# Patient Record
Sex: Male | Born: 1955
Health system: Southern US, Community
[De-identification: ages and names within clinical notes are randomized; demographics above are authoritative.]

## PROBLEM LIST (undated history)

## (undated) DIAGNOSIS — R7303 Prediabetes: Secondary | ICD-10-CM

## (undated) HISTORY — PX: COLONOSCOPY: SHX174

---

## 2017-09-02 ENCOUNTER — Other Ambulatory Visit (HOSPITAL_COMMUNITY): Payer: Self-pay | Admitting: Physician Assistant

## 2017-09-02 ENCOUNTER — Ambulatory Visit (HOSPITAL_COMMUNITY)
Admission: RE | Admit: 2017-09-02 | Discharge: 2017-09-02 | Disposition: A | Discharge status: Home / Self Care | Payer: BLUE CROSS/BLUE SHIELD | Source: Ambulatory Visit | Attending: Physician Assistant | Admitting: Physician Assistant

## 2017-09-02 DIAGNOSIS — M25562 Pain in left knee: Secondary | ICD-10-CM

## 2019-09-23 IMAGING — DX DG KNEE COMPLETE 4+V*L*
4 series · 4 of 4 positions shown · non-contrast
Comparison: None.

CLINICAL DATA: Pain following fall several weeks prior

EXAM:
LEFT KNEE - COMPLETE 4+ VIEW

[knee ap (1 of 3)]
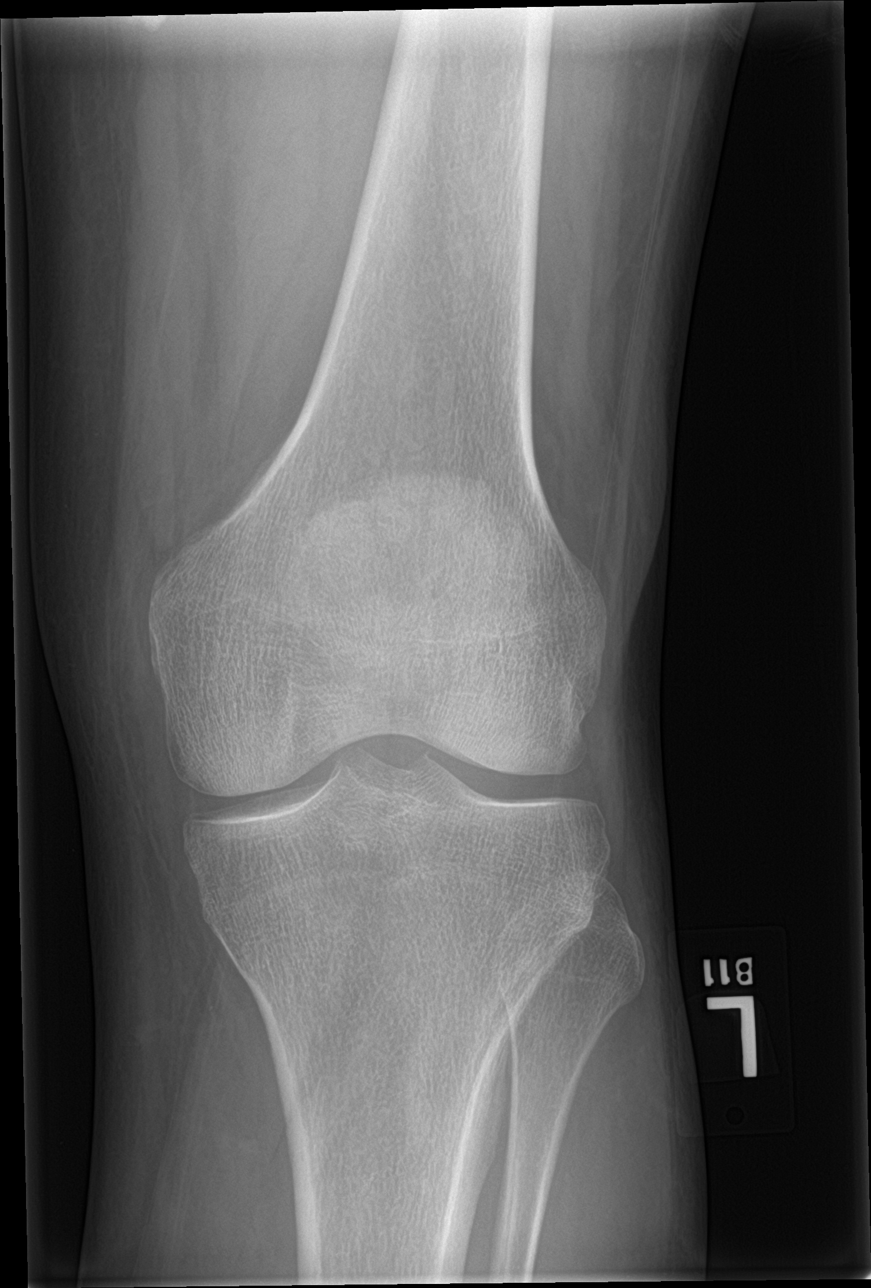

[knee ap (2 of 3)]
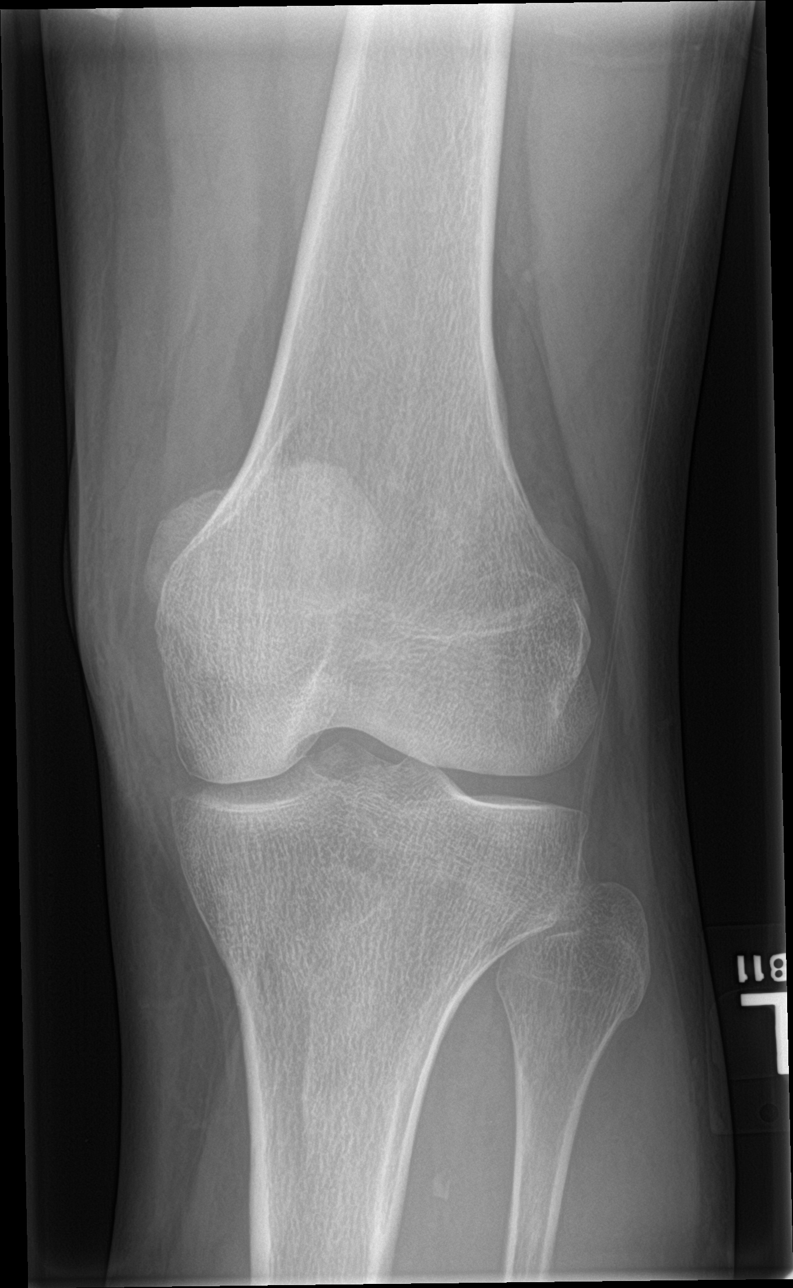

[knee ap (3 of 3)]
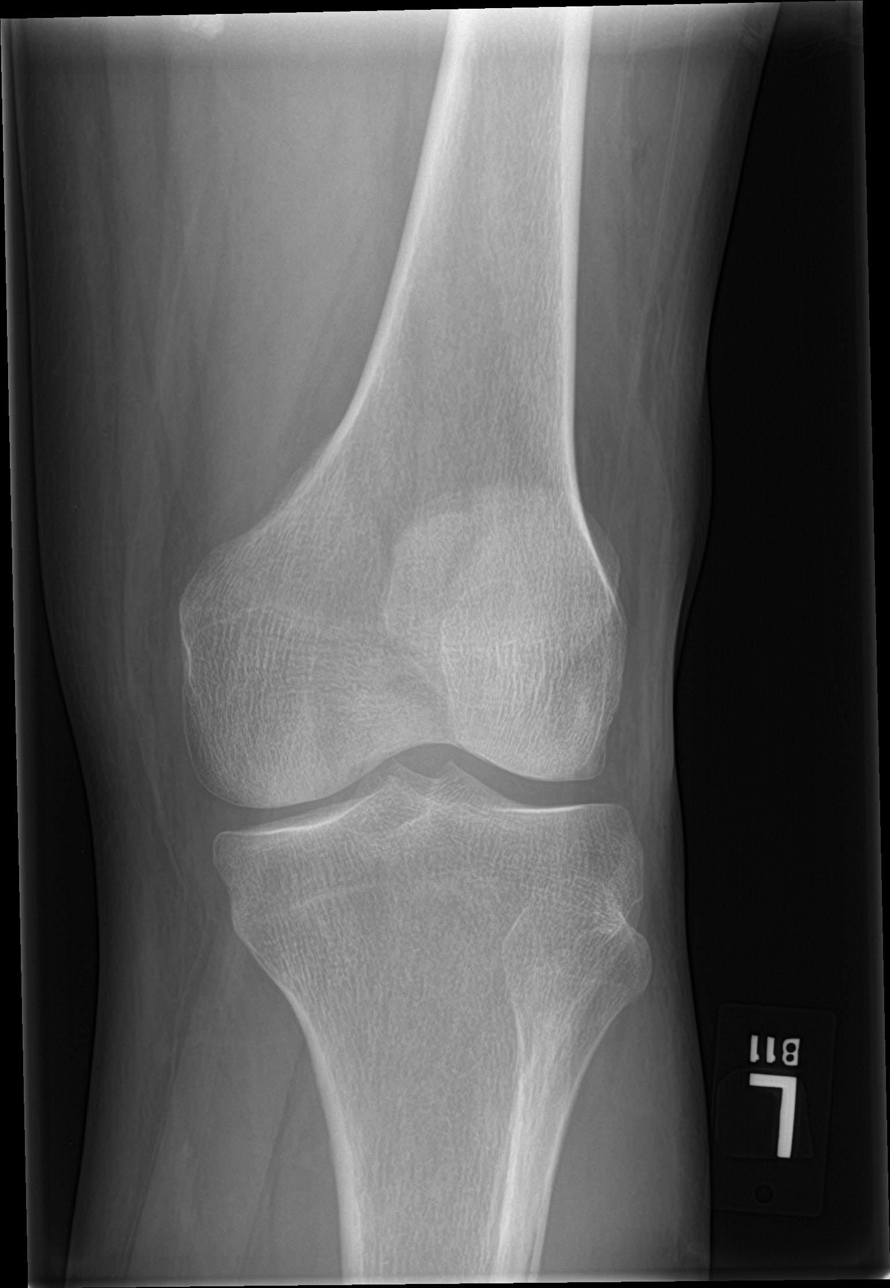

[knee lat]
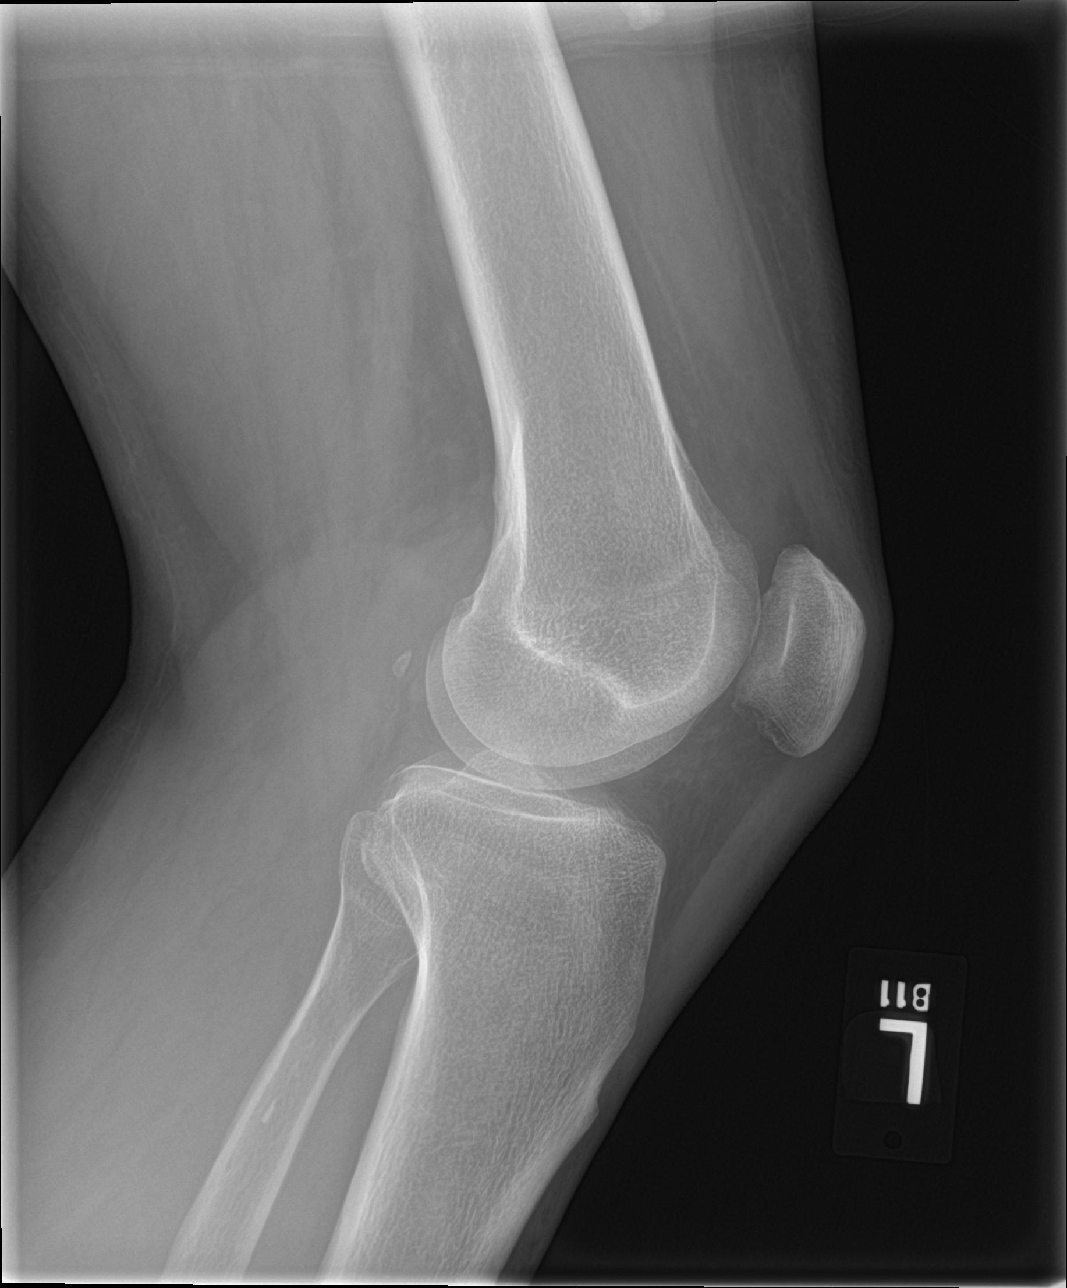

[4 of 4 positions shown; findings below may reference images not displayed]

FINDINGS: Frontal, lateral, and bilateral oblique views were obtained. No
fracture or dislocation. No joint effusion. Joint spaces appear
normal. No erosive change.
IMPRESSION: No fracture or dislocation. No joint effusion. No evident
adenopathy.

## 2020-06-21 ENCOUNTER — Emergency Department (HOSPITAL_COMMUNITY)
Admission: EM | Admit: 2020-06-21 | Discharge: 2020-06-21 | Disposition: A | Discharge status: Home / Self Care | Payer: 59 | Attending: Emergency Medicine | Admitting: Emergency Medicine

## 2020-06-21 ENCOUNTER — Emergency Department (HOSPITAL_COMMUNITY): Payer: 59

## 2020-06-21 ENCOUNTER — Encounter (HOSPITAL_COMMUNITY): Payer: Self-pay | Admitting: *Deleted

## 2020-06-21 ENCOUNTER — Other Ambulatory Visit: Payer: Self-pay

## 2020-06-21 DIAGNOSIS — S63282A Dislocation of proximal interphalangeal joint of right middle finger, initial encounter: Secondary | ICD-10-CM | POA: Diagnosis not present

## 2020-06-21 DIAGNOSIS — W01198A Fall on same level from slipping, tripping and stumbling with subsequent striking against other object, initial encounter: Secondary | ICD-10-CM | POA: Diagnosis not present

## 2020-06-21 DIAGNOSIS — Z87891 Personal history of nicotine dependence: Secondary | ICD-10-CM | POA: Insufficient documentation

## 2020-06-21 DIAGNOSIS — S6992XA Unspecified injury of left wrist, hand and finger(s), initial encounter: Secondary | ICD-10-CM | POA: Diagnosis present

## 2020-06-21 DIAGNOSIS — S63253A Unspecified dislocation of left middle finger, initial encounter: Secondary | ICD-10-CM

## 2020-06-21 MED ORDER — LIDOCAINE HCL (PF) 1 % IJ SOLN: 10.0000 mL | Freq: Once | INTRAMUSCULAR | Status: DC

## 2020-06-21 MED ORDER — LIDOCAINE HCL (PF) 1 % IJ SOLN
INTRAMUSCULAR | Status: AC
  Filled 2020-06-21: qty 10

## 2020-06-21 NOTE — ED Triage Notes (Signed)
Pt slipped on ice this morning landing on left side, denies hitting his head.  Deformity noted to left middle finger since fall.  C/o left side aching but no other symptoms.

## 2020-06-21 NOTE — Discharge Instructions (Signed)
Tylenol, Motrin, ice for pain.  Keep the finger splint on over the next week.  May return any worsening symptoms.

## 2020-06-21 NOTE — ED Provider Notes (Signed)
[facility] Provider Note   CSN: [id number] Arrival date & time: 06/21/20  1056     History Chief Complaint  Patient presents with  . Fall    Anthony Perez is a 65 y.o. male with no significant past medical history who presents for evaluation of finger pain.  Had a mechanical fall when he slipped on ice this morning.  Noted deformity to his left middle finger.  He has pain which he rates a 5/10.  Described as aching.  He denies hitting his head, LOC or anticoagulation.  He denies any other pain.  Tetanus up-to-date.  Does have abrasion to dorsal aspect right hand, midshaft forth metacarpal.  No pain to wrist, proximal hand.  Denies fever, chills, nausea, vomiting, headache, lightness, dizziness, chest pain, shortness of breath, paresthesias, weakness.  No preceding symptoms prior to fall.  He has been ambulatory since the incident.  Has not taken anything for pain.  History obtained from patient and past medical records.  No interpreter used  HPI     History reviewed. No pertinent past medical history.  There are no problems to display for this patient.   History reviewed. No pertinent surgical history.     History reviewed. No pertinent family history.  Social History   Tobacco Use  . Smoking status: Former Research scientist (life sciences)  . Smokeless tobacco: Never Used  Substance Use Topics  . Drug use: Never    Home Medications Prior to Admission medications   Not on File    Allergies    Patient has no known allergies.  Review of Systems   Review of Systems  Constitutional: Negative.   HENT: Negative.   Respiratory: Negative.   Cardiovascular: Negative.   Gastrointestinal: Negative.   Genitourinary: Negative.   Musculoskeletal:       Deformity to left middle finger at PIP  Skin: Positive for wound.  Neurological: Negative.   All other systems reviewed and are negative.   Physical Exam Updated Vital Signs BP (!) 163/88 (BP Location: Right Arm)    Pulse (!) 59   Temp 97.7 F (36.5 C) (Oral)   Resp 20   Ht 6\' 2"  (1.88 m)   Wt 85.7 kg   SpO2 97%   BMI 24.27 kg/m   Physical Exam Vitals and nursing note reviewed.  Constitutional:      General: He is not in acute distress.    Appearance: He is well-developed and well-nourished. He is not ill-appearing, toxic-appearing or diaphoretic.  HENT:     Head: Normocephalic and atraumatic.     Nose: Nose normal.     Mouth/Throat:     Mouth: Mucous membranes are moist.  Eyes:     Pupils: Pupils are equal, round, and reactive to light.  Cardiovascular:     Rate and Rhythm: Normal rate and regular rhythm.     Pulses: Normal pulses.     Heart sounds: Normal heart sounds.  Pulmonary:     Effort: Pulmonary effort is normal. No respiratory distress.     Breath sounds: Normal breath sounds.  Abdominal:     General: Bowel sounds are normal. There is no distension.     Palpations: Abdomen is soft. There is no mass.     Tenderness: There is no abdominal tenderness. There is no right CVA tenderness, left CVA tenderness, guarding or rebound.     Hernia: No hernia is present.  Musculoskeletal:        General: Swelling, tenderness, deformity and signs of  injury present. Normal range of motion.       Hands:     Cervical back: Normal range of motion and neck supple.     Comments: Obvious deformity to third digit, left upper extremity at PIP.  Appears dislocated.  There is some soft tissue swelling.  No bony tenderness to rest, proximal arm.  Moves all 4 extremities at difficulty.  Stable, nontender palpation.  No midline spinal tenderness.  Skin:    General: Skin is warm and dry.     Capillary Refill: Capillary refill takes less than 2 seconds.     Comments: Skin tear to dorsum of left hand.  No active bleeding or drainage.  Neurological:     Mental Status: He is alert.     Cranial Nerves: Cranial nerves are intact.     Sensory: Sensation is intact.     Motor: Motor function is intact.      Coordination: Coordination is intact.     Gait: Gait is intact.  Psychiatric:        Mood and Affect: Mood and affect normal.    ED Results / Procedures / Treatments   Labs (all labs ordered are listed, but only abnormal results are displayed) Labs Reviewed - No data to display  EKG None  Radiology DG Finger Middle Left  Result Date: 06/21/2020 CLINICAL DATA:  Slipped on ice this morning landing on LEFT side, deformity and aching of RIGHT LEFT middle finger, post reduction EXAM: LEFT MIDDLE FINGER 2+V COMPARISON:  None FINDINGS: Osseous mineralization normal. Soft tissue swelling at PIP joint LEFT middle finger extending into proximal phalanx. Reduction of previously identified PIP dislocation LEFT little finger. No definite fracture or bone destruction seen. Jewelry artifact at ring finger. IMPRESSION: Interval reduction of previously identified PIP joint dislocation LEFT middle finger. Electronically Signed   By: Lavonia Dana M.D.   On: 06/21/2020 14:45   DG Finger Middle Left  Result Date: 06/21/2020 CLINICAL DATA:  Post fall earlier today now with middle finger dislocation EXAM: LEFT MIDDLE FINGER 2+V COMPARISON:  None. FINDINGS: Dislocation of the PIP joint of the middle digit with the base of the middle phalanx perched upon the distal dorsal aspect of the proximal phalanx with slight angulation, apex lateral. No definitive associated fracture. Expected adjacent soft tissue swelling. No radiopaque foreign body. Joint spaces are otherwise well preserved. No definite erosions. No additional fractures identified. IMPRESSION: Dislocation of the PIP joint of the middle digit without associated fracture. Electronically Signed   By: Sandi Mariscal M.D.   On: 06/21/2020 12:11    Procedures Reduction of dislocation  Date/Time: 06/21/2020 3:05 PM Performed by: Nettie Elm, PA-C Authorized by: Nettie Elm, PA-C  Consent: Verbal consent obtained. Written consent not obtained. Risks  and benefits: risks, benefits and alternatives were discussed Consent given by: patient Patient understanding: patient states understanding of the procedure being performed Patient consent: the patient's understanding of the procedure matches consent given Procedure consent: procedure consent matches procedure scheduled Relevant documents: relevant documents present and verified Test results: test results available and properly labeled Site marked: the operative site was marked Imaging studies: imaging studies available Patient identity confirmed: verbally with patient and arm band Time out: Immediately prior to procedure a "time out" was called to verify the correct patient, procedure, equipment, support staff and site/side marked as required. Preparation: Patient was prepped and draped in the usual sterile fashion. Local anesthesia used: yes Anesthesia: digital block  Anesthesia: Local anesthesia used: yes  Local Anesthetic: lidocaine 1% without epinephrine Anesthetic total: 4 mL  Sedation: Patient sedated: no  Patient tolerance: patient tolerated the procedure well with no immediate complications  .Nerve Block  Date/Time: 06/21/2020 3:05 PM Performed by: Nettie Elm, PA-C Authorized by: Nettie Elm, PA-C   Consent:    Consent obtained:  Verbal   Consent given by:  Patient   Risks, benefits, and alternatives were discussed: yes     Risks discussed:  Allergic reaction, infection, nerve damage, swelling, unsuccessful block, pain, intravenous injection and bleeding   Alternatives discussed:  No treatment, delayed treatment, alternative treatment and referral Universal protocol:    Procedure explained and questions answered to patient or proxy's satisfaction: yes     Relevant documents present and verified: yes     Test results available: yes     Imaging studies available: yes     Required blood products, implants, devices, and special equipment available: yes      Site/side marked: yes     Immediately prior to procedure, a time out was called: yes     Patient identity confirmed:  Verbally with patient and arm band Indications:    Indications:  Pain relief and procedural anesthesia Location:    Body area:  Upper extremity   Upper extremity nerve blocked: Digit.   Laterality:  Left Pre-procedure details:    Skin preparation:  Chlorhexidine   Preparation: Patient was prepped and draped in usual sterile fashion   Skin anesthesia:    Skin anesthesia method:  None Procedure details:    Block needle gauge:  16 G   Anesthetic injected:  Lidocaine 1% w/o epi   Steroid injected:  None   Additive injected:  None   Injection procedure:  Anatomic landmarks identified, anatomic landmarks palpated, incremental injection, introduced needle and negative aspiration for blood   Paresthesia:  Immediately resolved Post-procedure details:    Dressing:  Sterile dressing   Outcome:  Anesthesia achieved   Procedure completion:  Tolerated well, no immediate complications .Ortho Injury Treatment  Date/Time: 06/21/2020 3:06 PM Performed by: Shelby Dubin A, PA-C Authorized by: Nettie Elm, PA-C   Consent:    Consent obtained:  Verbal   Consent given by:  Patient   Risks discussed:  Fracture, nerve damage, restricted joint movement, vascular damage, stiffness, recurrent dislocation and irreducible dislocation   Alternatives discussed:  Alternative treatment, no treatment, immobilization, referral and delayed treatmentInjury location: finger Location details: left long finger Injury type: dislocation Dislocation type: PIP Pre-procedure neurovascular assessment: neurovascularly intact Pre-procedure distal perfusion: normal Pre-procedure neurological function: normal Pre-procedure range of motion: normal Anesthesia: digital block  Anesthesia: Local anesthesia used: yes Anesthetic total: 4 mL  Patient sedated: NoImmobilization: splint Splint type:  static finger Post-procedure neurovascular assessment: post-procedure neurovascularly intact Post-procedure distal perfusion: normal Post-procedure neurological function: normal Post-procedure range of motion: normal Patient tolerance: patient tolerated the procedure well with no immediate complications    (including critical care time)  Medications Ordered in ED Medications  lidocaine (PF) (XYLOCAINE) 1 % injection 10 mL (has no administration in time range)  lidocaine (PF) (XYLOCAINE) 1 % injection (has no administration in time range)   ED Course  I have reviewed the triage vital signs and the nursing notes.  Pertinent labs & imaging results that were available during my care of the patient were reviewed by me and considered in my medical decision making (see chart for details).  65 year old with dislocation to PIP to middle finger on left upper extremity  which occurred after mechanical fall earlier today.  Has been ambulatory since the incident.  No paresthesias, weakness.  No preceding symptoms such as headache, lightness, dizziness, chest pain, shortness of breath.  He denies hitting his head, LOC or coagulation.  Tolerating p.o. intake.  X-ray obtained from triage shows dislocation to third digit on left upper extremity at PIP.  Digital block performed.  Reduction to dislocation.  See post reduction films.  Successful.  Placed in static finger splint.  Discussed RICE and management.  Will return for new worsening symptoms. Refused to remove ring however not on dislocated finger.  The patient has been appropriately medically screened and/or stabilized in the ED. I have low suspicion for any other emergent medical condition which would require further screening, evaluation or treatment in the ED or require inpatient management.  Patient is hemodynamically stable and in no acute distress.  Patient able to ambulate in department prior to ED.  Evaluation does not show acute pathology that  would require ongoing or additional emergent interventions while in the emergency department or further inpatient treatment.  I have discussed the diagnosis with the patient and answered all questions.  Pain is been managed while in the emergency department and patient has no further complaints prior to discharge.  Patient is comfortable with plan discussed in room and is stable for discharge at this time.  I have discussed strict return precautions for returning to the emergency department.  Patient was encouraged to follow-up with PCP/specialist refer to at discharge.    MDM Rules/Calculators/A&P                           Final Clinical Impression(s) / ED Diagnoses Final diagnoses:  Dislocation of left middle finger, initial encounter    Rx / DC Orders ED Discharge Orders    None       Rigdon Macomber A, PA-C 06/21/20 1509    Davonna Belling, MD 06/21/20 1525

## 2020-12-29 ENCOUNTER — Encounter (INDEPENDENT_AMBULATORY_CARE_PROVIDER_SITE_OTHER): Payer: Self-pay | Admitting: *Deleted

## 2021-03-20 ENCOUNTER — Telehealth (INDEPENDENT_AMBULATORY_CARE_PROVIDER_SITE_OTHER): Payer: Self-pay

## 2021-03-20 ENCOUNTER — Encounter (INDEPENDENT_AMBULATORY_CARE_PROVIDER_SITE_OTHER): Payer: Self-pay | Admitting: *Deleted

## 2021-03-20 ENCOUNTER — Other Ambulatory Visit (INDEPENDENT_AMBULATORY_CARE_PROVIDER_SITE_OTHER): Payer: Self-pay

## 2021-03-20 ENCOUNTER — Encounter (INDEPENDENT_AMBULATORY_CARE_PROVIDER_SITE_OTHER): Payer: Self-pay

## 2021-03-20 DIAGNOSIS — Z1211 Encounter for screening for malignant neoplasm of colon: Secondary | ICD-10-CM

## 2021-03-20 NOTE — Telephone Encounter (Signed)
Referring MD/PCP: Collene Mares   Procedure: Tcs  Reason/Indication:  Screening   Has patient had this procedure before?  yes  If so, when, by whom and where?  2009  Is there a family history of colon cancer?  no  Who?  What age when diagnosed?    Is patient diabetic? If yes, Type 1 or Type 2   no      Does patient have prosthetic heart valve or mechanical valve?  no  Do you have a pacemaker/defibrillator?  no  Has patient ever had endocarditis/atrial fibrillation? no  Does patient use oxygen? no  Has patient had joint replacement within last 12 months?  no  Is patient constipated or do they take laxatives? no  Does patient have a history of alcohol/drug use?  no  Have you had a stroke/heart attack last 6 mths? no  Do you take medicine for weight loss?  no  For male patients,: do you still have your menstrual cycle? N/A  Is patient on blood thinner such as Coumadin, Plavix and/or Aspirin? no  Medications: nond  Allergies: nkda  Medication Adjustment per Dr Jenetta Downer none  Procedure date & time: Wednesday 04/19/21 at 9:00

## 2021-03-20 NOTE — Telephone Encounter (Signed)
Ok to schedule.  Thanks,  Keila Turan Castaneda Mayorga, MD Gastroenterology and Hepatology [facility]  

## 2021-04-17 ENCOUNTER — Telehealth (INDEPENDENT_AMBULATORY_CARE_PROVIDER_SITE_OTHER): Payer: Self-pay

## 2021-04-17 MED ORDER — PEG 3350-KCL-NA BICARB-NACL 420 G PO SOLR: 4000.0000 mL | ORAL | 0 refills | Status: DC

## 2021-04-17 NOTE — Telephone Encounter (Signed)
LeighAnn Kunta Hilleary, CMA  

## 2021-04-19 ENCOUNTER — Ambulatory Visit (HOSPITAL_COMMUNITY): Payer: 59 | Admitting: Anesthesiology

## 2021-04-19 ENCOUNTER — Encounter (HOSPITAL_COMMUNITY): Payer: Self-pay | Admitting: Gastroenterology

## 2021-04-19 ENCOUNTER — Ambulatory Visit (HOSPITAL_COMMUNITY)
Admission: RE | Admit: 2021-04-19 | Discharge: 2021-04-19 | Disposition: A | Discharge status: Home / Self Care | Payer: 59 | Attending: Gastroenterology | Admitting: Gastroenterology

## 2021-04-19 ENCOUNTER — Other Ambulatory Visit: Payer: Self-pay

## 2021-04-19 ENCOUNTER — Encounter (HOSPITAL_COMMUNITY)
Admission: RE | Disposition: A | Discharge status: Home / Self Care | Payer: Self-pay | Source: Home / Self Care | Attending: Gastroenterology

## 2021-04-19 DIAGNOSIS — Z1211 Encounter for screening for malignant neoplasm of colon: Secondary | ICD-10-CM | POA: Diagnosis not present

## 2021-04-19 DIAGNOSIS — Q438 Other specified congenital malformations of intestine: Secondary | ICD-10-CM

## 2021-04-19 DIAGNOSIS — Z87891 Personal history of nicotine dependence: Secondary | ICD-10-CM | POA: Insufficient documentation

## 2021-04-19 DIAGNOSIS — K644 Residual hemorrhoidal skin tags: Secondary | ICD-10-CM

## 2021-04-19 DIAGNOSIS — D124 Benign neoplasm of descending colon: Secondary | ICD-10-CM | POA: Diagnosis not present

## 2021-04-19 DIAGNOSIS — K4090 Unilateral inguinal hernia, without obstruction or gangrene, not specified as recurrent: Secondary | ICD-10-CM | POA: Diagnosis not present

## 2021-04-19 DIAGNOSIS — K649 Unspecified hemorrhoids: Secondary | ICD-10-CM | POA: Diagnosis not present

## 2021-04-19 HISTORY — PX: POLYPECTOMY: SHX5525

## 2021-04-19 HISTORY — PX: COLONOSCOPY WITH PROPOFOL: SHX5780

## 2021-04-19 SURGERY — COLONOSCOPY WITH PROPOFOL
Anesthesia: General

## 2021-04-19 MED ORDER — PROPOFOL 1000 MG/100ML IV EMUL
INTRAVENOUS | Status: AC
  Filled 2021-04-19: qty 100

## 2021-04-19 MED ORDER — PROPOFOL 500 MG/50ML IV EMUL
INTRAVENOUS | Status: DC | PRN
  Administered 2021-04-19: 150 ug/kg/min via INTRAVENOUS

## 2021-04-19 MED ORDER — PROPOFOL 10 MG/ML IV BOLUS
INTRAVENOUS | Status: DC | PRN
  Administered 2021-04-19: 100 mg via INTRAVENOUS

## 2021-04-19 MED ORDER — LACTATED RINGERS IV SOLN
INTRAVENOUS | Status: DC
  Administered 2021-04-19: 1000 mL via INTRAVENOUS

## 2021-04-19 NOTE — Discharge Instructions (Addendum)
You are being discharged to home.  Resume your previous diet.  We are waiting for your pathology results.  Consider virtual colonoscopy. Surgery evaluation for inguinal hernia repair. Repeat colonoscopy can be performed in the future if inguinal hernia is repaired.     Leann notified at the office for referral to Surgeon for Hernia Repair.   Office to call. Per Dr. Jenetta Downer call office if difficulty passing bowel movement or passing urine, come to ER if Emergency

## 2021-04-19 NOTE — Op Note (Signed)
[facility] Patient Name: Anthony Perez Procedure Date: 04/19/2021 8:08 AM MRN: [id number] Date of Birth: [birth year] Attending MD: Maylon Peppers ,  CSN: [id number] Age: 65 Admit Type: Outpatient Procedure:                Colonoscopy Indications:              Screening for colorectal malignant neoplasm Providers:                Maylon Peppers, Rosina Lowenstein, RN, Casimer Bilis, Technician Referring MD:              Medicines:                Monitored Anesthesia Care Complications:            No immediate complications. Estimated Blood Loss:     Estimated blood loss: none. Procedure:                Pre-Anesthesia Assessment:                           - Prior to the procedure, a History and Physical                            was performed, and patient medications, allergies                            and sensitivities were reviewed. The patient's                            tolerance of previous anesthesia was reviewed.                           - The risks and benefits of the procedure and the                            sedation options and risks were discussed with the                            patient. All questions were answered and informed                            consent was obtained.                           - ASA Grade Assessment: I - A normal, healthy                            patient.                           After obtaining informed consent, the colonoscope                            was passed under direct vision. Throughout the  procedure, the patient's blood pressure, pulse, and                            oxygen saturations were monitored continuously. The                            [id number]-[id number] ([id number]) was introduced through the                            anus with the intention of advancing to the splenic                            flexure. The scope was advanced to the descending                             colon before the procedure was aborted. Medications                            were not given. The colonoscopy was extremely                            difficult due to significant looping in the sigmoid                            colon - there was presence of a inguinoscotral                            hernia retaining the scope. The patient tolerated                            the procedure well. The quality of the bowel                            preparation was excellent. Scope In: 8:23:30 AM Scope Out: 9:03:01 AM Total Procedure Duration: 0 hours 39 minutes 31 seconds  Findings:      Skin tags were found on perianal exam.      Hemorrhoids were found on perianal exam.      A 4 mm polyp was found in the distal descending colon. The polyp was       sessile. The polyp was removed with a cold snare. Resection and       retrieval were complete.      The sigmoid colon revealed significantly excessive looping. There was       evidence of significant looping in the left inguinoscrotal area due to       presence of hernia. Patient was put on his back and pressure was applied       but significant looping was still present, which onyl allowed passage of       the scope to the med descending colon. Impression:               - Perianal skin tags found on perianal exam.                           - Hemorrhoids  found on perianal exam.                           - One 4 mm polyp in the distal descending colon,                            removed with a cold snare. Resected and retrieved.                           - There was significant looping of the colon. Moderate Sedation:      Per Anesthesia Care Recommendation:           - Discharge patient to home (ambulatory).                           - Resume previous diet.                           - Await pathology results.                           - Will discuss virtual colonoscopy.                           - Surgery evaluation for  inguinal hernia repair.                           - Repeat colonoscopy can be performed in the future                            if inguinal hernia is repaired. Procedure Code(s):        --- Professional ---                           [phone], 52, Colonoscopy, flexible; with removal of                            tumor(s), polyp(s), or other lesion(s) by snare                            technique Diagnosis Code(s):        --- Professional ---                           Z12.11, Encounter for screening for malignant                            neoplasm of colon                           K63.5, Polyp of colon                           K64.9, Unspecified hemorrhoids                           K64.4, Residual hemorrhoidal skin tags CPT  copyright 2019 American Medical Association. All rights reserved. The codes documented in this report are preliminary and upon coder review may  be revised to meet current compliance requirements. Maylon Peppers, MD Maylon Peppers,  04/19/2021 9:11:46 AM This report has been signed electronically. Number of Addenda: 0

## 2021-04-19 NOTE — H&P (Signed)
Anthony Perez is an 66 y.o. male.   Chief Complaint: Screening colonoscopy HPI: 65 year old male with no relevant past medical history, coming for screening colonoscopy.  Patient reports he had colonoscopy 10 years ago at [place], no report is available.  The patient reports that no polyps were found at that time. the patient denies having any complaints such as melena, hematochezia, abdominal pain or distention, change in her bowel movement consistency or frequency, no changes in her weight recently.  No family history of colorectal cancer.   History reviewed. No pertinent past medical history.  Past Surgical History:  Procedure Laterality Date   COLONOSCOPY      History reviewed. No pertinent family history. Social History:  reports that he has quit smoking. He has never used smokeless tobacco. He reports that he does not use drugs. No history on file for alcohol use.  Allergies: No Known Allergies  Medications Prior to Admission  Medication Sig Dispense Refill   Cyanocobalamin (B-12) 5000 MCG CAPS Take 5,000 mcg by mouth daily.     polyethylene glycol-electrolytes (TRILYTE) 420 g solution Take 4,000 mLs by mouth as directed. 4000 mL 0    No results found for this or any previous visit (from the past 48 hour(s)). No results found.  Review of Systems  Constitutional: Negative.   HENT: Negative.    Eyes: Negative.   Respiratory: Negative.    Cardiovascular: Negative.   Gastrointestinal: Negative.   Endocrine: Negative.   Genitourinary: Negative.   Musculoskeletal: Negative.   Skin: Negative.   Allergic/Immunologic: Negative.   Neurological: Negative.   Hematological: Negative.   Psychiatric/Behavioral: Negative.     Blood pressure (!) 150/84, pulse 64, temperature 97.6 F (36.4 C), temperature source Oral, resp. rate 12, height 6\' 2"  (1.88 m), weight 86.2 kg, SpO2 100 %. Physical Exam  GENERAL: The patient is AO x3, in no acute distress. HEENT: Head is normocephalic  and atraumatic. EOMI are intact. Mouth is well hydrated and without lesions. NECK: Supple. No masses LUNGS: Clear to auscultation. No presence of rhonchi/wheezing/rales. Adequate chest expansion HEART: RRR, normal s1 and s2. ABDOMEN: Soft, nontender, no guarding, no peritoneal signs, and nondistended. BS +. No masses. EXTREMITIES: Without any cyanosis, clubbing, rash, lesions or edema. NEUROLOGIC: AOx3, no focal motor deficit. SKIN: no jaundice, no rashes  Assessment/Plan 65 year old male with no relevant past medical history, coming for screening colonoscopy.  The patient is at average risk for colorectal cancer.  We will proceed with colonoscopy today.   Harvel Quale, MD 04/19/2021, 8:14 AM

## 2021-04-19 NOTE — Progress Notes (Signed)
Dr. Jenetta Downer in to speak to patient. Wife Al Corpus Som notified at [phone] with findings and recommendations.

## 2021-04-19 NOTE — Anesthesia Preprocedure Evaluation (Signed)
Anesthesia Evaluation  Patient identified by MRN, date of birth, ID band Patient awake    Reviewed: Allergy & Precautions, H&P , NPO status , Patient's Chart, lab work & pertinent test results, reviewed documented beta blocker date and time   Airway Mallampati: II  TM Distance: >3 FB Neck ROM: full    Dental no notable dental hx.    Pulmonary neg pulmonary ROS, former smoker,    Pulmonary exam normal breath sounds clear to auscultation       Cardiovascular Exercise Tolerance: Good negative cardio ROS   Rhythm:regular Rate:Normal     Neuro/Psych negative neurological ROS  negative psych ROS   GI/Hepatic negative GI ROS, Neg liver ROS,   Endo/Other  negative endocrine ROS  Renal/GU negative Renal ROS  negative genitourinary   Musculoskeletal   Abdominal   Peds  Hematology negative hematology ROS (+)   Anesthesia Other Findings   Reproductive/Obstetrics negative OB ROS                             Anesthesia Physical Anesthesia Plan  ASA: 1  Anesthesia Plan: General   Post-op Pain Management:    Induction:   PONV Risk Score and Plan: Propofol infusion  Airway Management Planned:   Additional Equipment:   Intra-op Plan:   Post-operative Plan:   Informed Consent: I have reviewed the patients History and Physical, chart, labs and discussed the procedure including the risks, benefits and alternatives for the proposed anesthesia with the patient or authorized representative who has indicated his/her understanding and acceptance.     Dental Advisory Given  Plan Discussed with: CRNA  Anesthesia Plan Comments:         Anesthesia Quick Evaluation

## 2021-04-19 NOTE — Anesthesia Postprocedure Evaluation (Signed)
Anesthesia Post Note  Patient: Anthony Perez  Procedure(s) Performed: COLONOSCOPY WITH PROPOFOL POLYPECTOMY  Patient location during evaluation: Phase II Anesthesia Type: General Level of consciousness: awake Pain management: pain level controlled Vital Signs Assessment: post-procedure vital signs reviewed and stable Respiratory status: spontaneous breathing and respiratory function stable Cardiovascular status: blood pressure returned to baseline and stable Postop Assessment: no headache and no apparent nausea or vomiting Anesthetic complications: no Comments: Late entry   No notable events documented.   Last Vitals:  Vitals:   04/19/21 0917 04/19/21 0926  BP: 100/61 108/63  Pulse: (!) 47 (!) 48  Resp: 19 20  Temp:    SpO2: 96% 96%    Last Pain:  Vitals:   04/19/21 0926  TempSrc:   PainSc: [place]

## 2021-04-19 NOTE — Transfer of Care (Signed)
Immediate Anesthesia Transfer of Care Note  Patient: Anthony Perez  Procedure(s) Performed: COLONOSCOPY WITH PROPOFOL POLYPECTOMY  Patient Location: PACU  Anesthesia Type:General  Level of Consciousness: awake, alert , oriented and patient cooperative  Airway & Oxygen Therapy: Patient Spontanous Breathing  Post-op Assessment: Report given to RN, Post -op Vital signs reviewed and stable and Patient moving all extremities X 4  Post vital signs: Reviewed and stable  Last Vitals:  Vitals Value Taken Time  BP 98/62 04/19/21 0906  Temp 36.6 C 04/19/21 0906  Pulse 54 04/19/21 0906  Resp 21 04/19/21 0906  SpO2 88 % 04/19/21 0906    Last Pain:  Vitals:   04/19/21 0906  TempSrc: Axillary  PainSc: 0-No pain      Patients Stated Pain Goal: 8 (49/67/59 1638)  Complications: No notable events documented.

## 2021-04-20 LAB — SURGICAL PATHOLOGY

## 2021-04-21 ENCOUNTER — Encounter (HOSPITAL_COMMUNITY): Payer: Self-pay | Admitting: Gastroenterology

## 2021-05-04 ENCOUNTER — Ambulatory Visit (INDEPENDENT_AMBULATORY_CARE_PROVIDER_SITE_OTHER): Payer: 59 | Admitting: General Surgery

## 2021-05-04 ENCOUNTER — Encounter: Payer: Self-pay | Admitting: General Surgery

## 2021-05-04 ENCOUNTER — Other Ambulatory Visit: Payer: Self-pay

## 2021-05-04 VITALS — BP 138/84 | HR 60 | Temp 98.4°F | Resp 18 | Ht 74.0 in | Wt 202.0 lb

## 2021-05-04 DIAGNOSIS — K4090 Unilateral inguinal hernia, without obstruction or gangrene, not specified as recurrent: Secondary | ICD-10-CM

## 2021-05-05 NOTE — Progress Notes (Signed)
Anthony Perez; [id number]; 1955-10-22   HPI Patient is a 65 year old white male who was referred to my care by Collene Mares for evaluation treatment of a left inguinal hernia.  Patient states he recently underwent colonoscopy and was told that it could not be fully completed due to a portion of his bowel within the scrotum.  He states he has had a hernia present for over a year, but it has increased in size and is causing him mild discomfort.  No nausea or vomiting have been noted.  It currently does not affect his daily lifestyle. History reviewed. No pertinent past medical history.  Past Surgical History:  Procedure Laterality Date   COLONOSCOPY     COLONOSCOPY WITH PROPOFOL N/A 04/19/2021   Procedure: COLONOSCOPY WITH PROPOFOL;  Surgeon: Harvel Quale, MD;  Location: [facility];  Service: Gastroenterology;  Laterality: N/A;  9:00   POLYPECTOMY  04/19/2021   Procedure: POLYPECTOMY;  Surgeon: Harvel Quale, MD;  Location: [facility];  Service: Gastroenterology;;    History reviewed. No pertinent family history.  Current Outpatient Medications on File Prior to Visit  Medication Sig Dispense Refill   Cyanocobalamin (B-12) 5000 MCG CAPS Take 5,000 mcg by mouth daily.     No current facility-administered medications on file prior to visit.    No Known Allergies  Social History   Substance and Sexual Activity  Alcohol Use None   Comment: occ. use    Social History   Tobacco Use  Smoking Status Former  Smokeless Tobacco Never  Tobacco Comments   Quit 2006    Review of Systems  Constitutional: Negative.   HENT: Negative.    Eyes: Negative.   Respiratory: Negative.    Cardiovascular: Negative.   Gastrointestinal: Negative.   Genitourinary: Negative.   Musculoskeletal: Negative.   Skin: Negative.   Neurological: Negative.   Endo/Heme/Allergies: Negative.   Psychiatric/Behavioral: Negative.     Objective   Vitals:   05/04/21 1119  BP:  138/84  Pulse: 60  Resp: 18  Temp: 98.4 F (36.9 C)  SpO2: 98%    Physical Exam Vitals reviewed.  Constitutional:      Appearance: Normal appearance. He is normal weight. He is not ill-appearing.  HENT:     Head: Normocephalic and atraumatic.  Cardiovascular:     Rate and Rhythm: Normal rate and regular rhythm.     Heart sounds: Normal heart sounds. No murmur heard.   No friction rub. No gallop.  Pulmonary:     Effort: Pulmonary effort is normal. No respiratory distress.     Breath sounds: Normal breath sounds. No stridor. No wheezing, rhonchi or rales.  Abdominal:     General: Bowel sounds are normal. There is no distension.     Palpations: Abdomen is soft. There is no mass.     Tenderness: There is no abdominal tenderness. There is no guarding or rebound.     Hernia: A hernia is present.     Comments: Left inguinal hernia which is large.  Skin:    General: Skin is warm and dry.  Neurological:     Mental Status: He is alert and oriented to person, place, and time.    Assessment  Left inguinal hernia, occasionally symptomatic Plan  The risks and benefits of the procedure including bleeding, infection, mesh use, and the possibility of recurrence of the hernia were fully explained to the patient.  As he is not too symptomatic, he would like to defer surgery at this  time, which is fine with me.  He does having some anxiety about undergoing general anesthesia.  I told him to call me should he have any questions.  It is very large, so the risk of incarceration is low.  Follow-up as needed.

## 2022-07-12 IMAGING — DX DG FINGER MIDDLE 2+V*L*
3 series · 3 of 3 positions shown · non-contrast
Comparison: None

CLINICAL DATA: Slipped on ice this morning landing on LEFT side,
deformity and aching of RIGHT LEFT middle finger, post reduction

EXAM:
LEFT MIDDLE FINGER 2+V

[finger ap]
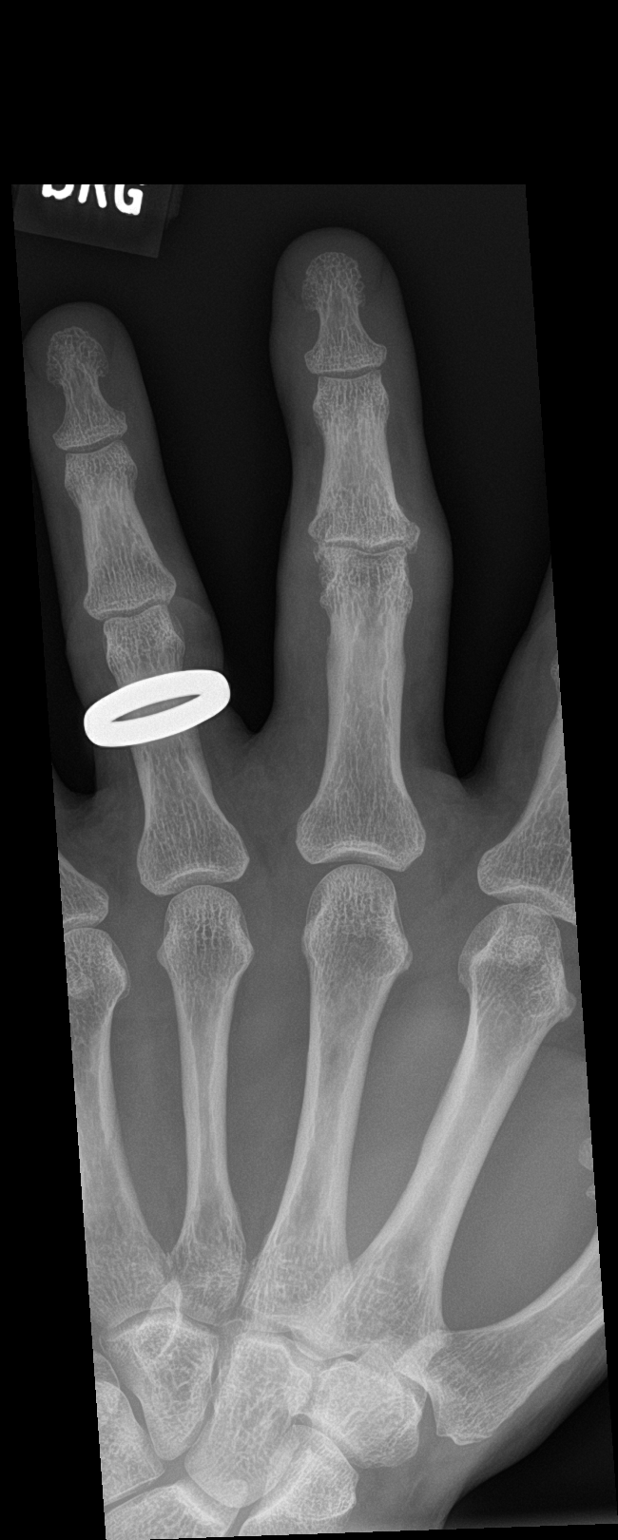

[finger obl]
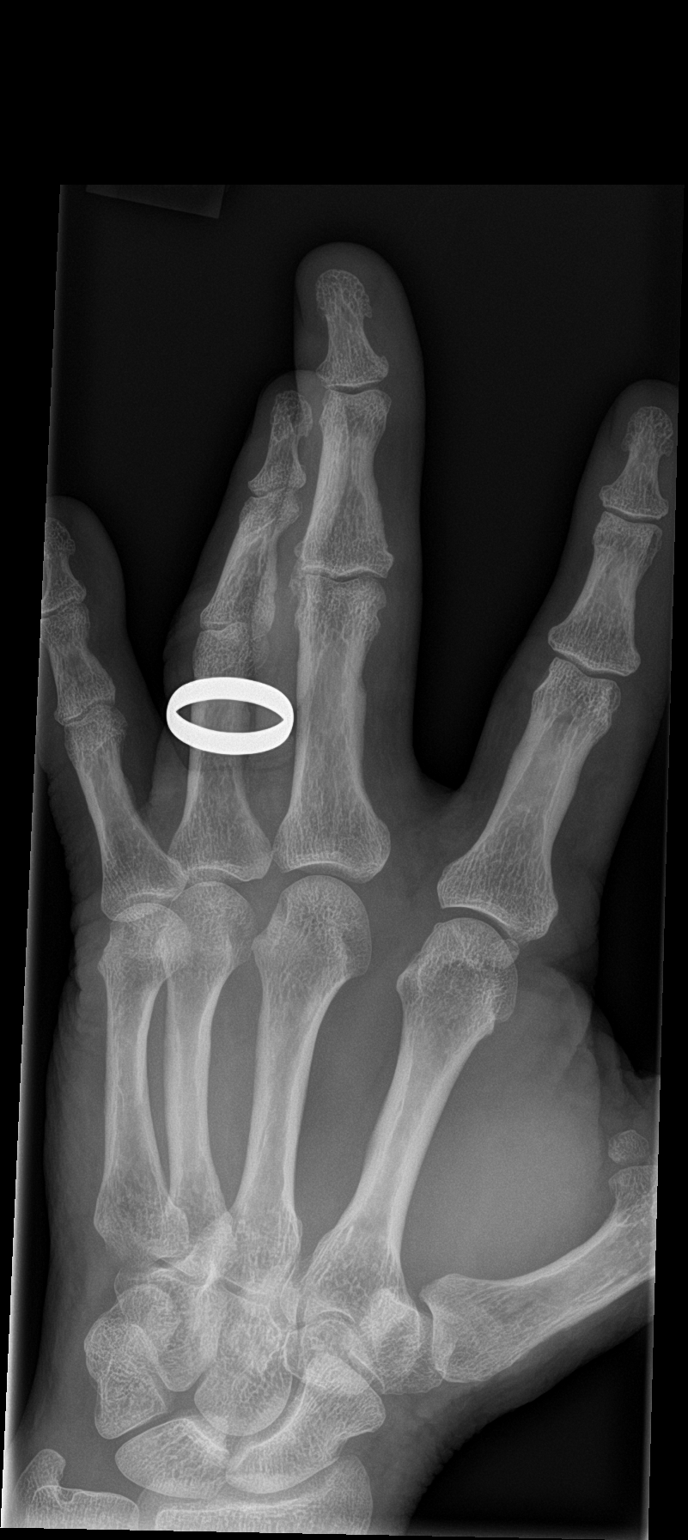

[finger lat]
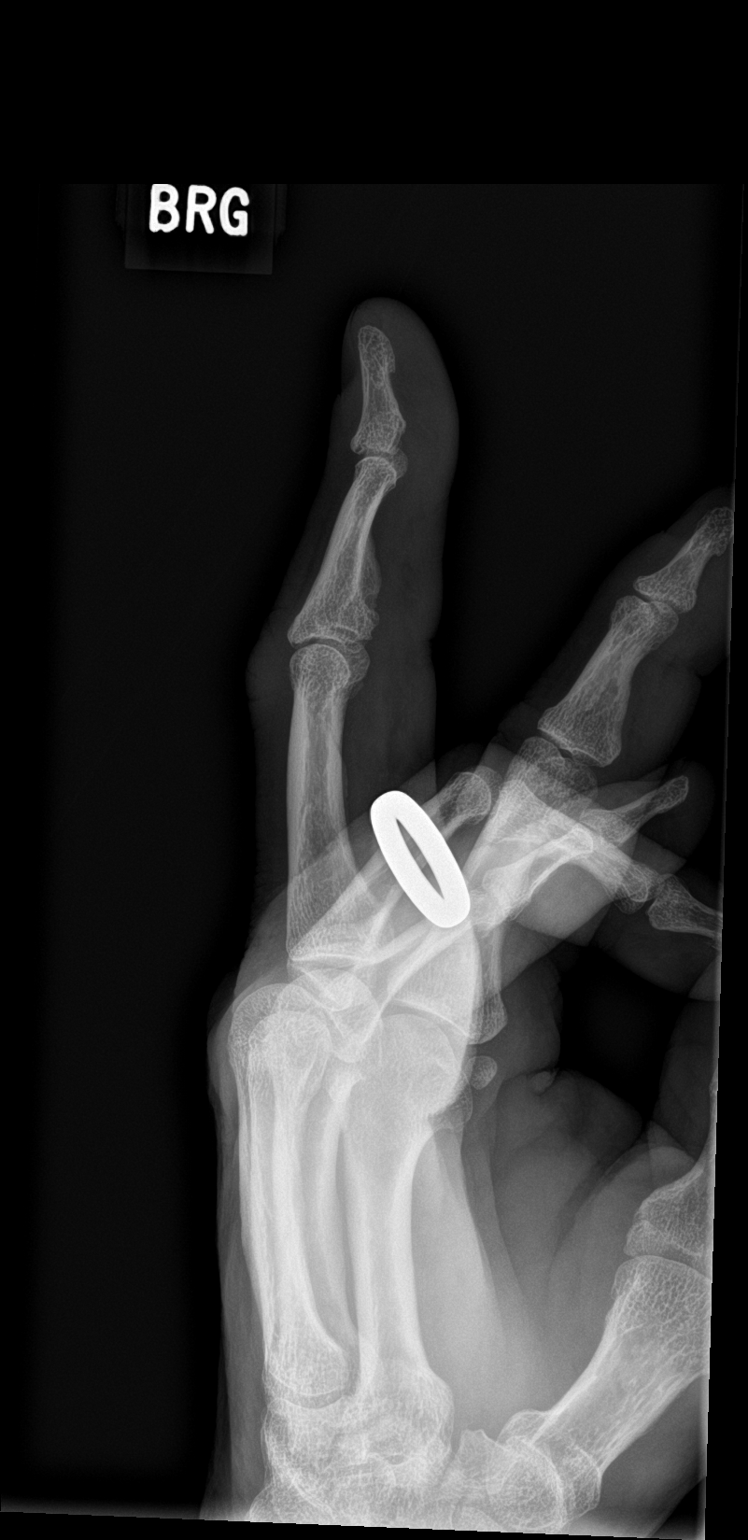

[3 of 3 positions shown; findings below may reference images not displayed]

FINDINGS: Osseous mineralization normal.

Soft tissue swelling at PIP joint LEFT middle finger extending into
proximal phalanx.

Reduction of previously identified PIP dislocation LEFT little
finger.

No definite fracture or bone destruction seen.

Jewelry artifact at ring finger.
IMPRESSION: Interval reduction of previously identified PIP joint dislocation
LEFT middle finger.

## 2022-10-11 DIAGNOSIS — Z6824 Body mass index (BMI) 24.0-24.9, adult: Secondary | ICD-10-CM | POA: Diagnosis not present

## 2022-10-11 DIAGNOSIS — D518 Other vitamin B12 deficiency anemias: Secondary | ICD-10-CM | POA: Diagnosis not present

## 2022-10-11 DIAGNOSIS — E559 Vitamin D deficiency, unspecified: Secondary | ICD-10-CM | POA: Diagnosis not present

## 2022-10-11 DIAGNOSIS — G9332 Myalgic encephalomyelitis/chronic fatigue syndrome: Secondary | ICD-10-CM | POA: Diagnosis not present

## 2022-10-11 DIAGNOSIS — E782 Mixed hyperlipidemia: Secondary | ICD-10-CM | POA: Diagnosis not present

## 2022-10-11 DIAGNOSIS — K4090 Unilateral inguinal hernia, without obstruction or gangrene, not specified as recurrent: Secondary | ICD-10-CM | POA: Diagnosis not present

## 2022-10-11 DIAGNOSIS — R7309 Other abnormal glucose: Secondary | ICD-10-CM | POA: Diagnosis not present

## 2022-10-11 DIAGNOSIS — Z0001 Encounter for general adult medical examination with abnormal findings: Secondary | ICD-10-CM | POA: Diagnosis not present

## 2022-10-11 DIAGNOSIS — M722 Plantar fascial fibromatosis: Secondary | ICD-10-CM | POA: Diagnosis not present

## 2022-10-11 DIAGNOSIS — R7303 Prediabetes: Secondary | ICD-10-CM | POA: Diagnosis not present

## 2022-10-25 ENCOUNTER — Ambulatory Visit: Payer: Medicare Other | Admitting: General Surgery

## 2022-10-25 ENCOUNTER — Encounter: Payer: Self-pay | Admitting: General Surgery

## 2022-10-25 VITALS — BP 105/69 | HR 54 | Temp 97.4°F | Resp 14 | Ht 74.0 in | Wt 188.0 lb

## 2022-10-25 DIAGNOSIS — K4090 Unilateral inguinal hernia, without obstruction or gangrene, not specified as recurrent: Secondary | ICD-10-CM

## 2022-10-25 NOTE — H&P (Signed)
Anthony Perez; [id number]; 1955/09/19   HPI Patient is a 67 year old white male who returns back to my care for evaluation and treatment of his left inguinal hernia.  I last saw him in my office in December 2022.  At that time, he did have a reducible left inguinal hernia but he wanted to wait on any surgical intervention.  Since that time, he has had some more pain and swelling in the left groin region.  He denies any nausea or vomiting.  It is starting to affect his daily lifestyle. History reviewed. No pertinent past medical history.  Past Surgical History:  Procedure Laterality Date   COLONOSCOPY     COLONOSCOPY WITH PROPOFOL N/A 04/19/2021   Procedure: COLONOSCOPY WITH PROPOFOL;  Surgeon: Dolores Frame, MD;  Location: [facility];  Service: Gastroenterology;  Laterality: N/A;  9:00   POLYPECTOMY  04/19/2021   Procedure: POLYPECTOMY;  Surgeon: Dolores Frame, MD;  Location: [facility];  Service: Gastroenterology;;    History reviewed. No pertinent family history.  Current Outpatient Medications on File Prior to Visit  Medication Sig Dispense Refill   rosuvastatin (CRESTOR) 10 MG tablet Take 10 mg by mouth daily.     Cyanocobalamin (B-12) 5000 MCG CAPS Take 5,000 mcg by mouth daily.     No current facility-administered medications on file prior to visit.    No Known Allergies  Social History   Substance and Sexual Activity  Alcohol Use None   Comment: occ. use    Social History   Tobacco Use  Smoking Status Former  Smokeless Tobacco Never  Tobacco Comments   Quit 2006    Review of Systems  Constitutional: Negative.   HENT: Negative.    Eyes: Negative.   Respiratory: Negative.    Cardiovascular: Negative.   Gastrointestinal: Negative.   Genitourinary: Negative.   Musculoskeletal: Negative.   Skin: Negative.   Neurological: Negative.   Endo/Heme/Allergies: Negative.   Psychiatric/Behavioral: Negative.      Objective   Vitals:    10/25/22 0904  BP: 105/69  Pulse: (!) 54  Resp: 14  Temp: (!) 97.4 F (36.3 C)  SpO2: 96%    Physical Exam Vitals reviewed.  Constitutional:      Appearance: Normal appearance. He is normal weight. He is not ill-appearing.  HENT:     Head: Normocephalic and atraumatic.  Cardiovascular:     Rate and Rhythm: Normal rate and regular rhythm.     Heart sounds: Normal heart sounds. No murmur heard.    No friction rub. No gallop.  Pulmonary:     Effort: Pulmonary effort is normal. No respiratory distress.     Breath sounds: Normal breath sounds. No stridor. No wheezing, rhonchi or rales.  Abdominal:     General: Abdomen is flat. Bowel sounds are normal. There is no distension.     Palpations: Abdomen is soft. There is no mass.     Tenderness: There is no abdominal tenderness. There is no guarding or rebound.     Hernia: A hernia is present.     Comments: Reducible left inguinal hernia with bowel contents present.  No right inguinal hernia  Genitourinary:    Testes: Normal.  Skin:    General: Skin is warm and dry.  Neurological:     Mental Status: He is alert and oriented to person, place, and time.    Previous office notes were viewed Assessment  Left inguinal hernia Plan  Patient is scheduled for robotic assisted laparoscopic left  inguinal herniorrhaphy with mesh on 11/08/2022.  The risks and benefits of the procedure including bleeding, infection, mesh use, the possibility of recurrence of the hernia, and the possibility of an open procedure were fully explained to the patient, who gave informed consent.

## 2022-10-25 NOTE — Progress Notes (Signed)
Anthony Perez; [id number]; 02/02/1956   HPI Patient is a 67-year-old white male who returns back to my care for evaluation and treatment of his left inguinal hernia.  I last saw him in my office in December 2022.  At that time, he did have a reducible left inguinal hernia but he wanted to wait on any surgical intervention.  Since that time, he has had some more pain and swelling in the left groin region.  He denies any nausea or vomiting.  It is starting to affect his daily lifestyle. History reviewed. No pertinent past medical history.  Past Surgical History:  Procedure Laterality Date   COLONOSCOPY     COLONOSCOPY WITH PROPOFOL N/A 04/19/2021   Procedure: COLONOSCOPY WITH PROPOFOL;  Surgeon: Castaneda Mayorga, Daniel, MD;  Location: [facility];  Service: Gastroenterology;  Laterality: N/A;  9:00   POLYPECTOMY  04/19/2021   Procedure: POLYPECTOMY;  Surgeon: Castaneda Mayorga, Daniel, MD;  Location: [facility];  Service: Gastroenterology;;    History reviewed. No pertinent family history.  Current Outpatient Medications on File Prior to Visit  Medication Sig Dispense Refill   rosuvastatin (CRESTOR) 10 MG tablet Take 10 mg by mouth daily.     Cyanocobalamin (B-12) 5000 MCG CAPS Take 5,000 mcg by mouth daily.     No current facility-administered medications on file prior to visit.    No Known Allergies  Social History   Substance and Sexual Activity  Alcohol Use None   Comment: occ. use    Social History   Tobacco Use  Smoking Status Former  Smokeless Tobacco Never  Tobacco Comments   Quit 2006    Review of Systems  Constitutional: Negative.   HENT: Negative.    Eyes: Negative.   Respiratory: Negative.    Cardiovascular: Negative.   Gastrointestinal: Negative.   Genitourinary: Negative.   Musculoskeletal: Negative.   Skin: Negative.   Neurological: Negative.   Endo/Heme/Allergies: Negative.   Psychiatric/Behavioral: Negative.      Objective   Vitals:    10/25/22 0904  BP: 105/69  Pulse: (!) 54  Resp: 14  Temp: (!) 97.4 F (36.3 C)  SpO2: 96%    Physical Exam Vitals reviewed.  Constitutional:      Appearance: Normal appearance. He is normal weight. He is not ill-appearing.  HENT:     Head: Normocephalic and atraumatic.  Cardiovascular:     Rate and Rhythm: Normal rate and regular rhythm.     Heart sounds: Normal heart sounds. No murmur heard.    No friction rub. No gallop.  Pulmonary:     Effort: Pulmonary effort is normal. No respiratory distress.     Breath sounds: Normal breath sounds. No stridor. No wheezing, rhonchi or rales.  Abdominal:     General: Abdomen is flat. Bowel sounds are normal. There is no distension.     Palpations: Abdomen is soft. There is no mass.     Tenderness: There is no abdominal tenderness. There is no guarding or rebound.     Hernia: A hernia is present.     Comments: Reducible left inguinal hernia with bowel contents present.  No right inguinal hernia  Genitourinary:    Testes: Normal.  Skin:    General: Skin is warm and dry.  Neurological:     Mental Status: He is alert and oriented to person, place, and time.    Previous office notes were viewed Assessment  Left inguinal hernia Plan  Patient is scheduled for robotic assisted laparoscopic left   inguinal herniorrhaphy with mesh on 11/08/2022.  The risks and benefits of the procedure including bleeding, infection, mesh use, the possibility of recurrence of the hernia, and the possibility of an open procedure were fully explained to the patient, who gave informed consent. 

## 2022-11-06 ENCOUNTER — Encounter (HOSPITAL_COMMUNITY): Payer: Self-pay

## 2022-11-06 ENCOUNTER — Encounter (HOSPITAL_COMMUNITY)
Admission: RE | Admit: 2022-11-06 | Discharge: 2022-11-06 | Disposition: A | Discharge status: Home / Self Care | Payer: Medicare Other | Source: Ambulatory Visit | Attending: General Surgery | Admitting: General Surgery

## 2022-11-06 ENCOUNTER — Other Ambulatory Visit: Payer: Self-pay

## 2022-11-06 HISTORY — DX: Prediabetes: R73.03

## 2022-11-08 ENCOUNTER — Ambulatory Visit (HOSPITAL_BASED_OUTPATIENT_CLINIC_OR_DEPARTMENT_OTHER): Payer: Medicare Other | Admitting: Certified Registered Nurse Anesthetist

## 2022-11-08 ENCOUNTER — Other Ambulatory Visit: Payer: Self-pay

## 2022-11-08 ENCOUNTER — Encounter (HOSPITAL_COMMUNITY): Payer: Self-pay | Admitting: General Surgery

## 2022-11-08 ENCOUNTER — Ambulatory Visit (HOSPITAL_COMMUNITY): Payer: Medicare Other | Admitting: Certified Registered Nurse Anesthetist

## 2022-11-08 ENCOUNTER — Encounter (HOSPITAL_COMMUNITY)
Admission: RE | Disposition: A | Discharge status: Home / Self Care | Payer: Self-pay | Source: Home / Self Care | Attending: General Surgery

## 2022-11-08 ENCOUNTER — Ambulatory Visit (HOSPITAL_COMMUNITY)
Admission: RE | Admit: 2022-11-08 | Discharge: 2022-11-08 | Disposition: A | Discharge status: Home / Self Care | Payer: Medicare Other | Attending: General Surgery | Admitting: General Surgery

## 2022-11-08 DIAGNOSIS — K4030 Unilateral inguinal hernia, with obstruction, without gangrene, not specified as recurrent: Secondary | ICD-10-CM | POA: Diagnosis not present

## 2022-11-08 DIAGNOSIS — Z87891 Personal history of nicotine dependence: Secondary | ICD-10-CM | POA: Diagnosis not present

## 2022-11-08 HISTORY — PX: XI ROBOTIC ASSISTED INGUINAL HERNIA REPAIR WITH MESH: SHX6706

## 2022-11-08 LAB — GLUCOSE, CAPILLARY: Glucose-Capillary: 102 mg/dL — ABNORMAL HIGH (ref 70–99)

## 2022-11-08 SURGERY — REPAIR, HERNIA, INGUINAL, ROBOT-ASSISTED, LAPAROSCOPIC, USING MESH
Anesthesia: General | Site: Abdomen | Laterality: Left

## 2022-11-08 MED ORDER — DEXAMETHASONE SODIUM PHOSPHATE 10 MG/ML IJ SOLN
INTRAMUSCULAR | Status: DC | PRN
  Administered 2022-11-08: 10 mg via INTRAVENOUS

## 2022-11-08 MED ORDER — CHLORHEXIDINE GLUCONATE 0.12 % MT SOLN: 15.0000 mL | Freq: Once | OROMUCOSAL | Status: AC

## 2022-11-08 MED ORDER — LACTATED RINGERS IV SOLN: INTRAVENOUS | Status: DC

## 2022-11-08 MED ORDER — ROCURONIUM BROMIDE 10 MG/ML (PF) SYRINGE
PREFILLED_SYRINGE | INTRAVENOUS | Status: AC
  Filled 2022-11-08: qty 20

## 2022-11-08 MED ORDER — BUPIVACAINE HCL (PF) 0.5 % IJ SOLN
INTRAMUSCULAR | Status: DC | PRN
  Administered 2022-11-08: 30 mL

## 2022-11-08 MED ORDER — OXYCODONE HCL 5 MG/5ML PO SOLN: 5.0000 mg | Freq: Once | ORAL | Status: AC | PRN

## 2022-11-08 MED ORDER — CHLORHEXIDINE GLUCONATE 0.12 % MT SOLN
OROMUCOSAL | Status: AC
  Administered 2022-11-08: 15 mL via OROMUCOSAL
  Filled 2022-11-08: qty 15

## 2022-11-08 MED ORDER — CHLORHEXIDINE GLUCONATE 0.12 % MT SOLN: 15.0000 mL | Freq: Once | OROMUCOSAL | Status: DC

## 2022-11-08 MED ORDER — MIDAZOLAM HCL 5 MG/5ML IJ SOLN
INTRAMUSCULAR | Status: DC | PRN
  Administered 2022-11-08: 1 mg via INTRAVENOUS

## 2022-11-08 MED ORDER — BUPIVACAINE HCL (PF) 0.5 % IJ SOLN
INTRAMUSCULAR | Status: AC
  Filled 2022-11-08: qty 30

## 2022-11-08 MED ORDER — ONDANSETRON HCL 4 MG/2ML IJ SOLN: 4.0000 mg | Freq: Once | INTRAMUSCULAR | Status: DC | PRN

## 2022-11-08 MED ORDER — MIDAZOLAM HCL 2 MG/2ML IJ SOLN
INTRAMUSCULAR | Status: AC
  Filled 2022-11-08: qty 2

## 2022-11-08 MED ORDER — LACTATED RINGERS IV SOLN: INTRAVENOUS | Status: DC | PRN

## 2022-11-08 MED ORDER — STERILE WATER FOR IRRIGATION IR SOLN
Status: DC | PRN
  Administered 2022-11-08: 500 mL

## 2022-11-08 MED ORDER — GLYCOPYRROLATE PF 0.2 MG/ML IJ SOSY
PREFILLED_SYRINGE | INTRAMUSCULAR | Status: DC | PRN
  Administered 2022-11-08: .2 mg via INTRAVENOUS

## 2022-11-08 MED ORDER — ACETAMINOPHEN 10 MG/ML IV SOLN
INTRAVENOUS | Status: AC
  Filled 2022-11-08: qty 100

## 2022-11-08 MED ORDER — PROPOFOL 10 MG/ML IV BOLUS
INTRAVENOUS | Status: DC | PRN
  Administered 2022-11-08: 100 mg via INTRAVENOUS
  Administered 2022-11-08: 40 mg via INTRAVENOUS

## 2022-11-08 MED ORDER — LIDOCAINE HCL (CARDIAC) PF 100 MG/5ML IV SOSY
PREFILLED_SYRINGE | INTRAVENOUS | Status: DC | PRN
  Administered 2022-11-08: 100 mg via INTRATRACHEAL

## 2022-11-08 MED ORDER — FENTANYL CITRATE (PF) 100 MCG/2ML IJ SOLN
INTRAMUSCULAR | Status: DC | PRN
  Administered 2022-11-08: 100 ug via INTRAVENOUS

## 2022-11-08 MED ORDER — PHENYLEPHRINE HCL (PRESSORS) 10 MG/ML IV SOLN
INTRAVENOUS | Status: DC | PRN
  Administered 2022-11-08 (×4): 160 ug via INTRAVENOUS

## 2022-11-08 MED ORDER — CHLORHEXIDINE GLUCONATE CLOTH 2 % EX PADS: 6.0000 | MEDICATED_PAD | Freq: Once | CUTANEOUS | Status: DC

## 2022-11-08 MED ORDER — SUGAMMADEX SODIUM 200 MG/2ML IV SOLN
INTRAVENOUS | Status: DC | PRN
  Administered 2022-11-08: 60 mg via INTRAVENOUS
  Administered 2022-11-08: 40 mg via INTRAVENOUS
  Administered 2022-11-08 (×2): 50 mg via INTRAVENOUS

## 2022-11-08 MED ORDER — OXYCODONE HCL 5 MG PO TABS
5.0000 mg | ORAL_TABLET | Freq: Once | ORAL | Status: AC | PRN
  Administered 2022-11-08: 5 mg via ORAL
  Filled 2022-11-08: qty 1

## 2022-11-08 MED ORDER — ONDANSETRON HCL 4 MG/2ML IJ SOLN
INTRAMUSCULAR | Status: DC | PRN
  Administered 2022-11-08: 4 mg via INTRAVENOUS

## 2022-11-08 MED ORDER — OXYCODONE HCL 5 MG PO TABS: 5.0000 mg | ORAL_TABLET | ORAL | 0 refills | Status: DC | PRN

## 2022-11-08 MED ORDER — ACETAMINOPHEN 10 MG/ML IV SOLN
INTRAVENOUS | Status: DC | PRN
  Administered 2022-11-08: 1000 mg via INTRAVENOUS

## 2022-11-08 MED ORDER — ORAL CARE MOUTH RINSE: 15.0000 mL | Freq: Once | OROMUCOSAL | Status: DC

## 2022-11-08 MED ORDER — FENTANYL CITRATE PF 50 MCG/ML IJ SOSY
25.0000 ug | PREFILLED_SYRINGE | INTRAMUSCULAR | Status: DC | PRN
  Administered 2022-11-08 (×2): 50 ug via INTRAVENOUS
  Filled 2022-11-08 (×2): qty 1

## 2022-11-08 MED ORDER — CEFAZOLIN SODIUM-DEXTROSE 2-4 GM/100ML-% IV SOLN
2.0000 g | INTRAVENOUS | Status: AC
  Administered 2022-11-08: 2 g via INTRAVENOUS
  Filled 2022-11-08: qty 100

## 2022-11-08 MED ORDER — FENTANYL CITRATE (PF) 100 MCG/2ML IJ SOLN
INTRAMUSCULAR | Status: AC
  Filled 2022-11-08: qty 2

## 2022-11-08 MED ORDER — ROCURONIUM BROMIDE 10 MG/ML (PF) SYRINGE
PREFILLED_SYRINGE | INTRAVENOUS | Status: DC | PRN
  Administered 2022-11-08: 100 mg via INTRAVENOUS
  Administered 2022-11-08: 10 mg via INTRAVENOUS

## 2022-11-08 SURGICAL SUPPLY — 50 items
ADH SKN CLS APL DERMABOND .7 (GAUZE/BANDAGES/DRESSINGS) ×1
APL PRP STRL LF DISP 70% ISPRP (MISCELLANEOUS) ×1
CHLORAPREP W/TINT 26 (MISCELLANEOUS) ×1 IMPLANT
COVER LIGHT HANDLE STERIS (MISCELLANEOUS) ×1 IMPLANT
COVER MAYO STAND STRL (DRAPES) ×1 IMPLANT
COVER MAYO STAND XLG (MISCELLANEOUS) ×1 IMPLANT
COVER TIP SHEARS 8 DVNC (MISCELLANEOUS) ×1 IMPLANT
DEFOGGER SCOPE WARMER CLEARIFY (MISCELLANEOUS) IMPLANT
DERMABOND ADVANCED .7 DNX12 (GAUZE/BANDAGES/DRESSINGS) ×1 IMPLANT
DRAPE ARM DVNC X/XI (DISPOSABLE) ×3 IMPLANT
DRAPE COLUMN DVNC XI (DISPOSABLE) ×1 IMPLANT
DRAPE HALF SHEET 40X57 (DRAPES) ×1 IMPLANT
DRIVER NDL MEGA SUTCUT DVNCXI (INSTRUMENTS) ×1 IMPLANT
DRIVER NDLE MEGA SUTCUT DVNCXI (INSTRUMENTS) ×1 IMPLANT
ELECT REM PT RETURN 9FT ADLT (ELECTROSURGICAL) ×1
ELECTRODE REM PT RTRN 9FT ADLT (ELECTROSURGICAL) ×1 IMPLANT
FORCEPS BPLR R/ABLATION 8 DVNC (INSTRUMENTS) ×1 IMPLANT
GAUZE SPONGE 4X4 12PLY STRL (GAUZE/BANDAGES/DRESSINGS) ×1 IMPLANT
GLOVE BIOGEL PI IND STRL 7.0 (GLOVE) ×4 IMPLANT
GLOVE SURG SS PI 7.5 STRL IVOR (GLOVE) ×2 IMPLANT
GOWN STRL REUS W/TWL LRG LVL3 (GOWN DISPOSABLE) ×2 IMPLANT
GRASPER SUT TROCAR 14GX15 (MISCELLANEOUS) IMPLANT
GRASPER TIP-UP FEN DVNC XI (INSTRUMENTS) IMPLANT
KIT PINK PAD W/HEAD ARE REST (MISCELLANEOUS) ×1
KIT PINK PAD W/HEAD ARM REST (MISCELLANEOUS) ×1 IMPLANT
KIT TURNOVER KIT A (KITS) ×1 IMPLANT
MANIFOLD NEPTUNE II (INSTRUMENTS) ×1 IMPLANT
MESH 3DMAX MID 5X7 LT XLRG (Mesh General) IMPLANT
NDL HYPO 21X1.5 SAFETY (NEEDLE) ×1 IMPLANT
NDL INSUFFLATION 14GA 120MM (NEEDLE) ×1 IMPLANT
NEEDLE HYPO 21X1.5 SAFETY (NEEDLE) ×1 IMPLANT
NEEDLE INSUFFLATION 14GA 120MM (NEEDLE) ×1 IMPLANT
OBTURATOR OPTICAL STND 8 DVNC (TROCAR) ×1
OBTURATOR OPTICALSTD 8 DVNC (TROCAR) ×1 IMPLANT
PACK LAP CHOLE LZT030E (CUSTOM PROCEDURE TRAY) ×1 IMPLANT
PENCIL HANDSWITCHING (ELECTRODE) ×1 IMPLANT
SCISSORS MNPLR CVD DVNC XI (INSTRUMENTS) ×1 IMPLANT
SEAL CANN UNIV 5-8 DVNC XI (MISCELLANEOUS) ×3 IMPLANT
SET BASIN LINEN APH (SET/KITS/TRAYS/PACK) ×1 IMPLANT
SET TUBE SMOKE EVAC HIGH FLOW (TUBING) ×1 IMPLANT
SOL PREP POV-IOD 4OZ 10% (MISCELLANEOUS) ×1 IMPLANT
SUT MNCRL AB 4-0 PS2 18 (SUTURE) ×2 IMPLANT
SUT V-LOC 90 ABS 3-0 VLT V-20 (SUTURE) ×2 IMPLANT
SUT VIC AB 2-0 SH 27 (SUTURE) ×1
SUT VIC AB 2-0 SH 27X BRD (SUTURE) ×1 IMPLANT
SYR 30ML LL (SYRINGE) ×1 IMPLANT
TAPE TRANSPORE STRL 2 31045 (GAUZE/BANDAGES/DRESSINGS) ×1 IMPLANT
TRAY FOL W/BAG SLVR 16FR STRL (SET/KITS/TRAYS/PACK) ×1 IMPLANT
TRAY FOLEY W/BAG SLVR 16FR LF (SET/KITS/TRAYS/PACK) ×1
WATER STERILE IRR 500ML POUR (IV SOLUTION) ×1 IMPLANT

## 2022-11-08 NOTE — Op Note (Signed)
Patient:  Anthony Perez  DOB:  1955-12-03  MRN:  [id number]   Preop Diagnosis: Incarcerated left inguinal hernia  Postop Diagnosis: Same  Procedure: Robotic assisted laparoscopic left inguinal herniorrhaphy with mesh  Surgeon: Franky Macho, MD  Anes: General endotracheal  Indications: Patient is a 67 year old white male who presents with a symptomatic left inguinal hernia.  It is difficult to reduce.  The risks and benefits of the procedure including bleeding, infection, mesh use, and the possibility of recurrence of the hernia were fully explained to the patient, who gave informed consent.  Procedure note: The patient was placed in the supine position.  After induction of general endotracheal anesthesia, the abdomen was prepped and draped using usual sterile technique with ChloraPrep.  Surgical site confirmation was performed.  An incision was made in the left upper quadrant at Palmer's point.  A Veress needle was introduced into the abdominal cavity and confirmation of placement was done using the saline drop test.  The abdomen was then insufflated to 15 mmHg pressure.  An 8 mm trocar was introduced into the abdominal cavity under direct visualization without difficulty.  Additional 8 mm trocars were placed in the right upper and epigastric region and right flank regions.  The robot was then targeted and docked.  No right inguinal hernia was seen.  The patient had a large left inguinal hernia with incarcerated sigmoid colon present.  The peritoneal flap was made down to below Cooper's ligament.  After much dissection, I was able to reduce the sigmoid colon.  The remaining hernia sac was then freed away from the spermatic cord.  The indirect hernia defect was fully identified.  The spermatic cord was fully identified during the procedure.  A greater than 6 cm flap was noted posterior to the hernia defect.  An extra-large Bard 3D max mesh was then inserted and secured to Cooper's ligament using a  2-0 Vicryl interrupted suture.  An additional 2-0 Vicryl suture was placed anteriorly and laterally.  The peritoneal flap was then closed using a 3-0 V-Loc running suture.  The mesh was appropriately adherent to the abdominal wall.  The robot was then undocked.  All air was then evacuated from the abdominal cavity prior to the removal of the trocars.  All wounds were irrigated with normal saline.  All wounds were injected with 0.5% Sensorcaine.  All incisions were closed using a 4-0 Monocryl subcuticular suture.  Dermabond was applied.  All tape and needle counts were correct at the end of the procedure.  The patient was extubated in the operating room and transferred to PACU in stable condition.  Complications: None  EBL: Minimal  Specimen: None

## 2022-11-08 NOTE — Interval H&P Note (Signed)
History and Physical Interval Note:  11/08/2022 7:10 AM  Anthony Perez  has presented today for surgery, with the diagnosis of INGUINAL HERNIA, LEFT.  The various methods of treatment have been discussed with the patient and family. After consideration of risks, benefits and other options for treatment, the patient has consented to  Procedure(s): XI ROBOTIC ASSISTED INGUINAL HERNIA REPAIR WITH MESH (Left) as a surgical intervention.  The patient's history has been reviewed, patient examined, no change in status, stable for surgery.  I have reviewed the patient's chart and labs.  Questions were answered to the patient's satisfaction.     Franky Macho

## 2022-11-08 NOTE — Anesthesia Preprocedure Evaluation (Signed)
Anesthesia Evaluation  Patient identified by MRN, date of birth, ID band Patient awake    Reviewed: Allergy & Precautions, H&P , NPO status , Patient's Chart, lab work & pertinent test results, reviewed documented beta blocker date and time   Airway Mallampati: II  TM Distance: >3 FB Neck ROM: full    Dental no notable dental hx.    Pulmonary neg pulmonary ROS, former smoker   Pulmonary exam normal breath sounds clear to auscultation       Cardiovascular Exercise Tolerance: Good negative cardio ROS  Rhythm:regular Rate:Normal     Neuro/Psych negative neurological ROS  negative psych ROS   GI/Hepatic negative GI ROS, Neg liver ROS,,,  Endo/Other  negative endocrine ROS    Renal/GU negative Renal ROS  negative genitourinary   Musculoskeletal   Abdominal   Peds  Hematology negative hematology ROS (+)   Anesthesia Other Findings   Reproductive/Obstetrics negative OB ROS                             Anesthesia Physical Anesthesia Plan  ASA: 1  Anesthesia Plan: General and General ETT   Post-op Pain Management:    Induction:   PONV Risk Score and Plan: Ondansetron  Airway Management Planned:   Additional Equipment:   Intra-op Plan:   Post-operative Plan:   Informed Consent: I have reviewed the patients History and Physical, chart, labs and discussed the procedure including the risks, benefits and alternatives for the proposed anesthesia with the patient or authorized representative who has indicated his/her understanding and acceptance.     Dental Advisory Given  Plan Discussed with: CRNA  Anesthesia Plan Comments:        Anesthesia Quick Evaluation

## 2022-11-08 NOTE — Anesthesia Procedure Notes (Signed)
Procedure Name: Intubation Date/Time: 11/08/2022 8:29 AM  Performed by: Chelsea Aus, CRNAPre-anesthesia Checklist: Patient identified, Emergency Drugs available, Suction available and Patient being monitored Patient Re-evaluated:Patient Re-evaluated prior to induction Oxygen Delivery Method: Circle system utilized Preoxygenation: Pre-oxygenation with 100% oxygen Induction Type: IV induction Ventilation: Mask ventilation without difficulty and Oral airway inserted - appropriate to patient size Laryngoscope Size: Glidescope (LoPro S3) Tube type: Oral Tube size: 7.0 mm Airway Equipment and Method: Rigid stylet and Video-laryngoscopy Placement Confirmation: ETT inserted through vocal cords under direct vision, positive ETCO2 and breath sounds checked- equal and bilateral Secured at: 22 cm Tube secured with: Tape Dental Injury: Teeth and Oropharynx as per pre-operative assessment  Comments: TMD < 3, [place] F1

## 2022-11-08 NOTE — Transfer of Care (Signed)
Immediate Anesthesia Transfer of Care Note  Patient: Anthony Perez  Procedure(s) Performed: XI ROBOTIC ASSISTED INGUINAL HERNIA REPAIR WITH MESH (Left: Abdomen)  Patient Location: PACU  Anesthesia Type:General  Level of Consciousness: drowsy and patient cooperative  Airway & Oxygen Therapy: Patient Spontanous Breathing and Patient connected to nasal cannula oxygen  Post-op Assessment: Report given to RN and Post -op Vital signs reviewed and stable  Post vital signs: Reviewed and stable  Last Vitals:  Vitals Value Taken Time  BP 141/77 11/08/22 1026  Temp    Pulse 50 11/08/22 1027  Resp 11 11/08/22 1027  SpO2 100 % 11/08/22 1027  Vitals shown include unvalidated device data.  Last Pain:  Vitals:   11/08/22 0741  PainSc: 0-No pain         Complications: No notable events documented.

## 2022-11-09 NOTE — Anesthesia Postprocedure Evaluation (Signed)
Anesthesia Post Note  Patient: Anthony Perez  Procedure(s) Performed: XI ROBOTIC ASSISTED INGUINAL HERNIA REPAIR WITH MESH (Left: Abdomen)  Patient location during evaluation: Phase II Anesthesia Type: General Level of consciousness: awake Pain management: pain level controlled Vital Signs Assessment: post-procedure vital signs reviewed and stable Respiratory status: spontaneous breathing and respiratory function stable Cardiovascular status: blood pressure returned to baseline and stable Postop Assessment: no headache and no apparent nausea or vomiting Anesthetic complications: no Comments: Late entry   No notable events documented.   Last Vitals:  Vitals:   11/08/22 1115 11/08/22 1119  BP: (!) 160/87 (!) 157/82  Pulse: (!) 48 (!) 49  Resp: 12 14  Temp:  36.4 C  SpO2: 96% 99%    Last Pain:  Vitals:   11/08/22 1119  TempSrc: Oral  PainSc: 2                  Windell Norfolk

## 2022-11-13 ENCOUNTER — Encounter (HOSPITAL_COMMUNITY): Payer: Self-pay | Admitting: General Surgery

## 2022-11-20 ENCOUNTER — Ambulatory Visit (INDEPENDENT_AMBULATORY_CARE_PROVIDER_SITE_OTHER): Payer: Medicare Other | Admitting: General Surgery

## 2022-11-20 ENCOUNTER — Encounter: Payer: Self-pay | Admitting: General Surgery

## 2022-11-20 VITALS — BP 113/72 | HR 69 | Temp 98.2°F | Resp 14 | Ht 74.0 in | Wt 186.0 lb

## 2022-11-20 DIAGNOSIS — Z09 Encounter for follow-up examination after completed treatment for conditions other than malignant neoplasm: Secondary | ICD-10-CM

## 2022-11-20 NOTE — Progress Notes (Signed)
Subjective:     Anthony Perez  Patient here for follow-up, status post robotic assisted laparoscopic left inguinal herniorrhaphy with mesh.  Patient states he is doing well.  He does have some swelling in the left scrotal region.  He denies any nausea or vomiting.  He has minimal incisional pain. Objective:    BP 113/72   Pulse 69   Temp 98.2 F (36.8 C) (Oral)   Resp 14   Ht 6\' 2"  (1.88 m)   Wt 186 lb (84.4 kg)   SpO2 95%   BMI 23.88 kg/m   General:  alert, cooperative, and no distress  Abdomen soft, incisions healing well.  Resolving ecchymosis noted in the left groin region.  Patient has a swelling along the left spermatic cord.  50 cc of old hematoma aspirated.  No recurrent hernia present.     Assessment:    Doing well postoperatively.  Postoperative hematoma of left spermatic cord not unexpected given the size of his hernia. Plan:   I told the patient that any further fluid collection should resolve with time.  Should become more prominent, he was instructed to return to my office for evaluation.  May increase activity as able.  Follow-up here as needed.

## 2022-11-27 ENCOUNTER — Encounter: Payer: Self-pay | Admitting: General Surgery

## 2022-11-27 ENCOUNTER — Ambulatory Visit (INDEPENDENT_AMBULATORY_CARE_PROVIDER_SITE_OTHER): Payer: Medicare Other | Admitting: General Surgery

## 2022-11-27 VITALS — BP 115/74 | HR 61 | Temp 97.7°F | Resp 12 | Ht 74.0 in | Wt 186.0 lb

## 2022-11-27 DIAGNOSIS — Z09 Encounter for follow-up examination after completed treatment for conditions other than malignant neoplasm: Secondary | ICD-10-CM

## 2022-11-27 NOTE — Progress Notes (Signed)
Subjective:     Anthony Perez  Patient here for follow-up visit, status post robotic assisted laparoscopic left inguinal herniorrhaphy with mesh.  He did have a postoperative hematoma and residual space down the spermatic cord.  He states after the needle aspiration last week that it has recurred.  He otherwise is not having any significant abdominal pain. Objective:    BP 115/74   Pulse 61   Temp 97.7 F (36.5 C) (Oral)   Resp 12   Ht 6\' 2"  (1.88 m)   Wt 186 lb (84.4 kg)   SpO2 96%   BMI 23.88 kg/m   General:  alert, cooperative, and no distress  Abdomen is soft, incisions healing well.  Residual fluid along spermatic cord is noted.  18 cc of serosanguineous fluid was aspirated.  Patient tolerated the procedure well.     Assessment:    Doing well postoperatively. Postoperative serosanguineous fluid and residual hernia region.  Resolving.    Plan:   I told the patient this should resolve with time.  Should it increase in size significantly, he will return to my office for evaluation.

## 2022-12-27 ENCOUNTER — Encounter: Payer: Self-pay | Admitting: General Surgery

## 2022-12-27 ENCOUNTER — Ambulatory Visit (INDEPENDENT_AMBULATORY_CARE_PROVIDER_SITE_OTHER): Payer: Medicare Other | Admitting: General Surgery

## 2022-12-27 VITALS — BP 109/69 | HR 57 | Temp 97.4°F | Resp 12 | Ht 74.0 in | Wt 184.0 lb

## 2022-12-27 DIAGNOSIS — Z09 Encounter for follow-up examination after completed treatment for conditions other than malignant neoplasm: Secondary | ICD-10-CM

## 2022-12-27 NOTE — Progress Notes (Signed)
Subjective:     Anthony Perez  Here for follow-up wound check.  Patient is status post robotic assisted left inguinal herniorrhaphy with mesh.  He has had a hematoma of the spermatic cord, but he states that this is resolving.  He has no pain.  He has resuming normal activity. Objective:    BP 109/69   Pulse (!) 57   Temp (!) 97.4 F (36.3 C) (Oral)   Resp 12   Ht 6\' 2"  (1.88 m)   Wt 184 lb (83.5 kg)   SpO2 97%   BMI 23.62 kg/m   General:  alert, cooperative, and no distress  Abdomen soft, incisions healing well.  Left spermatic cord hematoma is smaller than the last time I saw it.     Assessment:    Doing well postoperatively.    Plan:   I told the patient that he may always have a residual firmness and a small area of this spermatic cord, but this does not need further intervention.  He understands and agrees.  He is pleased with results.  Follow-up here as needed.

## 2023-02-02 DIAGNOSIS — I499 Cardiac arrhythmia, unspecified: Secondary | ICD-10-CM | POA: Diagnosis not present

## 2023-02-02 DIAGNOSIS — I469 Cardiac arrest, cause unspecified: Secondary | ICD-10-CM | POA: Diagnosis not present

## 2023-02-02 DIAGNOSIS — W19XXXA Unspecified fall, initial encounter: Secondary | ICD-10-CM | POA: Diagnosis not present

## 2023-02-02 DIAGNOSIS — R0902 Hypoxemia: Secondary | ICD-10-CM | POA: Diagnosis not present

## 2023-02-02 DIAGNOSIS — R079 Chest pain, unspecified: Secondary | ICD-10-CM | POA: Diagnosis not present

## 2023-02-02 DIAGNOSIS — R000 Tachycardia, unspecified: Secondary | ICD-10-CM | POA: Diagnosis not present

## 2023-02-03 DEATH — deceased
